# Patient Record
Sex: Female | Born: 1935 | Race: White | Hispanic: No | Smoking: Never smoker
Health system: Southern US, Community
[De-identification: ages and names within clinical notes are randomized; demographics above are authoritative.]

## PROBLEM LIST (undated history)

## (undated) DIAGNOSIS — I4891 Unspecified atrial fibrillation: Secondary | ICD-10-CM

## (undated) DIAGNOSIS — I10 Essential (primary) hypertension: Secondary | ICD-10-CM

## (undated) DIAGNOSIS — J45909 Unspecified asthma, uncomplicated: Secondary | ICD-10-CM

## (undated) DIAGNOSIS — J449 Chronic obstructive pulmonary disease, unspecified: Secondary | ICD-10-CM

## (undated) DIAGNOSIS — M1990 Unspecified osteoarthritis, unspecified site: Secondary | ICD-10-CM

---

## 2013-09-26 ENCOUNTER — Encounter (HOSPITAL_BASED_OUTPATIENT_CLINIC_OR_DEPARTMENT_OTHER): Payer: Self-pay | Admitting: Emergency Medicine

## 2013-09-26 ENCOUNTER — Emergency Department (HOSPITAL_BASED_OUTPATIENT_CLINIC_OR_DEPARTMENT_OTHER): Payer: Medicare Other

## 2013-09-26 ENCOUNTER — Emergency Department (HOSPITAL_BASED_OUTPATIENT_CLINIC_OR_DEPARTMENT_OTHER)
Admission: EM | Admit: 2013-09-26 | Discharge: 2013-09-26 | Disposition: A | Discharge status: Home / Self Care | Payer: Medicare Other | Attending: Emergency Medicine | Admitting: Emergency Medicine

## 2013-09-26 DIAGNOSIS — I10 Essential (primary) hypertension: Secondary | ICD-10-CM | POA: Insufficient documentation

## 2013-09-26 DIAGNOSIS — R000 Tachycardia, unspecified: Secondary | ICD-10-CM | POA: Insufficient documentation

## 2013-09-26 DIAGNOSIS — J449 Chronic obstructive pulmonary disease, unspecified: Secondary | ICD-10-CM | POA: Insufficient documentation

## 2013-09-26 DIAGNOSIS — S62609A Fracture of unspecified phalanx of unspecified finger, initial encounter for closed fracture: Secondary | ICD-10-CM

## 2013-09-26 DIAGNOSIS — Z7901 Long term (current) use of anticoagulants: Secondary | ICD-10-CM | POA: Insufficient documentation

## 2013-09-26 DIAGNOSIS — J4489 Other specified chronic obstructive pulmonary disease: Secondary | ICD-10-CM | POA: Insufficient documentation

## 2013-09-26 DIAGNOSIS — IMO0002 Reserved for concepts with insufficient information to code with codable children: Secondary | ICD-10-CM | POA: Insufficient documentation

## 2013-09-26 DIAGNOSIS — Y9289 Other specified places as the place of occurrence of the external cause: Secondary | ICD-10-CM | POA: Insufficient documentation

## 2013-09-26 DIAGNOSIS — I4891 Unspecified atrial fibrillation: Secondary | ICD-10-CM | POA: Insufficient documentation

## 2013-09-26 DIAGNOSIS — Z79899 Other long term (current) drug therapy: Secondary | ICD-10-CM | POA: Insufficient documentation

## 2013-09-26 DIAGNOSIS — S6000XA Contusion of unspecified finger without damage to nail, initial encounter: Secondary | ICD-10-CM | POA: Insufficient documentation

## 2013-09-26 DIAGNOSIS — S79919A Unspecified injury of unspecified hip, initial encounter: Secondary | ICD-10-CM | POA: Insufficient documentation

## 2013-09-26 DIAGNOSIS — M129 Arthropathy, unspecified: Secondary | ICD-10-CM | POA: Insufficient documentation

## 2013-09-26 DIAGNOSIS — W06XXXA Fall from bed, initial encounter: Secondary | ICD-10-CM | POA: Insufficient documentation

## 2013-09-26 DIAGNOSIS — Y9389 Activity, other specified: Secondary | ICD-10-CM | POA: Insufficient documentation

## 2013-09-26 DIAGNOSIS — S79929A Unspecified injury of unspecified thigh, initial encounter: Secondary | ICD-10-CM

## 2013-09-26 HISTORY — DX: Unspecified atrial fibrillation: I48.91

## 2013-09-26 HISTORY — DX: Unspecified asthma, uncomplicated: J45.909

## 2013-09-26 HISTORY — DX: Unspecified osteoarthritis, unspecified site: M19.90

## 2013-09-26 HISTORY — DX: Chronic obstructive pulmonary disease, unspecified: J44.9

## 2013-09-26 HISTORY — DX: Essential (primary) hypertension: I10

## 2013-09-26 MED ORDER — CLONIDINE HCL 0.1 MG PO TABS
0.1000 mg | ORAL_TABLET | Freq: Once | ORAL | Status: AC
  Administered 2013-09-26: 0.1 mg via ORAL
  Filled 2013-09-26: qty 1

## 2013-09-26 NOTE — ED Notes (Signed)
Patient transported to X-ray 

## 2013-09-26 NOTE — ED Notes (Signed)
Sitting on bed putting on shoes and fell out of bed.  Right 5th digit pain

## 2013-09-26 NOTE — ED Notes (Signed)
Waiting for a ride

## 2013-09-26 NOTE — Discharge Instructions (Signed)
Finger Fracture  Fractures of fingers are breaks in the bones of the fingers. There are many types of fractures. There are different ways of treating these fractures. Your health care provider will discuss the best way to treat your fracture.  CAUSES  Traumatic injury is the main cause of broken fingers. These include:  · Injuries while playing sports.  · Workplace injuries.  · Falls.  RISK FACTORS  Activities that can increase your risk of finger fractures include:  · Sports.  · Workplace activities that involve machinery.  · A condition called osteoporosis, which can make your bones less dense and cause them to fracture more easily.  SIGNS AND SYMPTOMS  The main symptoms of a broken finger are pain and swelling within 15 minutes after the injury. Other symptoms include:  · Bruising of your finger.  · Stiffness of your finger.  · Numbness of your finger.  · Exposed bones (compound fracture) if the fracture is severe.  DIAGNOSIS   The best way to diagnose a broken bone is with X-ray imaging. Additionally, your health care provider will use this X-ray image to evaluate the position of the broken finger bones.   TREATMENT   Finger fractures can be treated with:   · Nonreduction This means the bones are in place. The finger is splinted without changing the positions of the bone pieces. The splint is usually left on for about a week to 10 days. This will depend on your fracture and what your health care provider thinks.  · Closed reduction The bones are put back into position without using surgery. The finger is then splinted.  · Open reduction and internal fixation The fracture site is opened. Then the bone pieces are fixed into place with pins or some type of hardware. This is seldom required. It depends on the severity of the fracture.  HOME CARE INSTRUCTIONS   · Follow your health care provider's instructions regarding activities, exercises, and physical therapy.  · Only take over-the-counter or prescription  medicines for pain, discomfort, or fever as directed by your health care provider.  SEEK MEDICAL CARE IF:  You have pain or swelling that limits the motion or use of your fingers.  SEEK IMMEDIATE MEDICAL CARE IF:   Your finger becomes numb.  MAKE SURE YOU:   · Understand these instructions.  · Will watch your condition.  · Will get help right away if you are not doing well or get worse.  Document Released: 08/06/2000 Document Revised: 02/12/2013 Document Reviewed: 12/04/2012  [employer]® Patient Information ©2014 [employer].

## 2013-09-26 NOTE — ED Provider Notes (Addendum)
CSN: [id number]     Arrival date & time 09/26/13  1808 History   First MD Initiated Contact with Patient 09/26/13 1813     Chief Complaint  Patient presents with  . Fall     (Consider location/radiation/quality/duration/timing/severity/associated sxs/prior Treatment) HPI Comments: Patient was sitting on the side of her bed [facility]. She lost her balance while she was putting on her shoes and fell sideways landing on her right hand in her right hip. She has a little bit of soreness in her right hip and she has some bruising and pain to her right hand. She denies hitting her head. There is no loss of consciousness. She denies any headache or dizziness. She denies any other injuries. She says that she has been able to bear weight on the hip without significant pain.  Patient is a 80 y.o. female presenting with fall.  Fall Pertinent negatives include no abdominal pain, no headaches and no shortness of breath.    Past Medical History  Diagnosis Date  . Asthma   . A-fib   . COPD (chronic obstructive pulmonary disease)   . Hypertension   . Arthritis    No past surgical history on file. No family history on file. History  Substance Use Topics  . Smoking status: Never Smoker   . Smokeless tobacco: Not on file  . Alcohol Use: No   OB History   Grav Para Term Preterm Abortions TAB SAB Ect Mult Living                 Review of Systems  Constitutional: Negative for fever.  HENT: Negative.  Negative for nosebleeds.   Respiratory: Negative for chest tightness and shortness of breath.   Cardiovascular: Negative for leg swelling.  Gastrointestinal: Negative for nausea, vomiting and abdominal pain.  Musculoskeletal: Positive for arthralgias and joint swelling. Negative for back pain and neck pain.  Skin: Negative for wound.  Neurological: Negative for weakness, numbness and headaches.  Psychiatric/Behavioral: Negative for confusion.      Allergies  Review of  patient's allergies indicates not on file.  Home Medications   Prior to Admission medications   Medication Sig Start Date End Date Taking? Authorizing Provider  acetaminophen (TYLENOL) 325 MG tablet Take 650 mg by mouth every 6 (six) hours as needed.   Yes Historical Provider, MD  amiodarone (PACERONE) 200 MG tablet Take 200 mg by mouth daily.   Yes Historical Provider, MD  cholecalciferol (VITAMIN D) 1000 UNITS tablet Take 2,000 Units by mouth daily.   Yes Historical Provider, MD  denosumab (PROLIA) 60 MG/ML SOLN injection Inject 60 mg into the skin every 6 (six) months. Administer in upper arm, thigh, or abdomen   Yes Historical Provider, MD  digoxin (LANOXIN) 0.125 MG tablet Take by mouth daily.   Yes Historical Provider, MD  docusate sodium (COLACE) 100 MG capsule Take 100 mg by mouth 2 (two) times daily.   Yes Historical Provider, MD  gabapentin (NEURONTIN) 300 MG capsule Take 300 mg by mouth 3 (three) times daily.   Yes Historical Provider, MD  levothyroxine (SYNTHROID, LEVOTHROID) 75 MCG tablet Take 75 mcg by mouth daily before breakfast.   Yes Historical Provider, MD  spironolactone (ALDACTONE) 25 MG tablet Take 25 mg by mouth daily.   Yes Historical Provider, MD  tamsulosin (FLOMAX) 0.4 MG CAPS capsule Take 0.4 mg by mouth.   Yes Historical Provider, MD  terbinafine (LAMISIL) 250 MG tablet Take 250 mg by mouth daily.  Yes Historical Provider, MD  warfarin (COUMADIN) 4 MG tablet Take 4 mg by mouth daily.   Yes Historical Provider, MD   BP 230/114  Pulse 62  Temp(Src) 97.8 F (36.6 C) (Oral)  Resp 16  Wt 101 lb (45.813 kg)  SpO2 99% Physical Exam  Constitutional: She is oriented to person, place, and time. She appears well-developed and well-nourished.  HENT:  Head: Normocephalic and atraumatic.  Eyes: Pupils are equal, round, and reactive to light.  Neck: Normal range of motion. Neck supple.  No pain along the spine  Cardiovascular: Normal rate and regular rhythm.   Murmur  heard. Pulmonary/Chest: Effort normal and breath sounds normal. No respiratory distress. She has no wheezes. She has no rales. She exhibits no tenderness.  Abdominal: Soft. Bowel sounds are normal. There is no tenderness. There is no rebound and no guarding.  Musculoskeletal: Normal range of motion. She exhibits edema and tenderness.  Positive swelling to the fifth finger of the right hand. There is ecchymosis and some ulnar deviation of the finger. She has normal sensation distally. She is able to flex and extend the finger with out significant deficits. She has no other pain on palpation or range of motion of the extremities including the hips.  Lymphadenopathy:    She has no cervical adenopathy.  Neurological: She is alert and oriented to person, place, and time.  Skin: Skin is warm and dry. No rash noted.  Psychiatric: She has a normal mood and affect.    ED Course  Procedures (including critical care time) Labs Review Labs Reviewed - No data to display  Imaging Review Dg Hand Complete Right  09/26/2013   CLINICAL DATA:  Fall today.  Small finger pain.  EXAM: RIGHT HAND - COMPLETE 3+ VIEW  COMPARISON:  None.  FINDINGS: The bones are diffusely demineralized. There is an oblique mildly displaced extra-articular fracture involving the proximal base of the fifth proximal phalanx. No other fractures are identified. There are advanced degenerative changes at the first carpal metacarpal articulation. Lesser degenerative changes are noted at the distal interphalangeal joint of the small finger.  IMPRESSION: Oblique mildly displaced fracture of the fifth proximal phalanx as described.   Electronically Signed   By: Roxy HorsemanBill  Veazey M.D.   On: 09/26/2013 19:01     EKG Interpretation None      MDM   Final diagnoses:  Fracture of phalanx of finger    Patient presents with a swollen finger. There is a fracture of the proximal phalanx of the fifth digit on the right hand. The finger was splinted  and buddy taped to the adjacent finger. She denies need for any pain medication. She has no other apparent injuries. She's ambulating in the ED without difficulty or pain on her head. She denies any head trauma. She is on Coumadin but there is no bruising or signs of head trauma and she denies hitting her head. She was referred to the hand surgeon on call for outpatient followup.  PT's blood pressure was markedly elevated in the ED.  She said that she has not yet had her BP meds.  Denies any symptoms.  Was given a dose of PO clonidine.  Rolan BuccoMelanie Millan Legan, MD 09/26/13 2015  Rolan BuccoMelanie Nico Syme, MD 09/26/13 2106

## 2013-09-26 NOTE — ED Notes (Signed)
Waiting for [facility] to come pick up pt

## 2013-11-29 ENCOUNTER — Encounter (HOSPITAL_BASED_OUTPATIENT_CLINIC_OR_DEPARTMENT_OTHER): Payer: Self-pay | Admitting: Emergency Medicine

## 2013-11-29 ENCOUNTER — Emergency Department (HOSPITAL_BASED_OUTPATIENT_CLINIC_OR_DEPARTMENT_OTHER)
Admission: EM | Admit: 2013-11-29 | Discharge: 2013-11-29 | Disposition: A | Discharge status: Home / Self Care | Payer: Medicare Other | Attending: Emergency Medicine | Admitting: Emergency Medicine

## 2013-11-29 DIAGNOSIS — I4891 Unspecified atrial fibrillation: Secondary | ICD-10-CM | POA: Insufficient documentation

## 2013-11-29 DIAGNOSIS — J449 Chronic obstructive pulmonary disease, unspecified: Secondary | ICD-10-CM | POA: Diagnosis not present

## 2013-11-29 DIAGNOSIS — Z8739 Personal history of other diseases of the musculoskeletal system and connective tissue: Secondary | ICD-10-CM | POA: Diagnosis not present

## 2013-11-29 DIAGNOSIS — Z880 Allergy status to penicillin: Secondary | ICD-10-CM | POA: Diagnosis not present

## 2013-11-29 DIAGNOSIS — N390 Urinary tract infection, site not specified: Secondary | ICD-10-CM

## 2013-11-29 DIAGNOSIS — I10 Essential (primary) hypertension: Secondary | ICD-10-CM | POA: Insufficient documentation

## 2013-11-29 DIAGNOSIS — R350 Frequency of micturition: Secondary | ICD-10-CM | POA: Insufficient documentation

## 2013-11-29 DIAGNOSIS — Z7901 Long term (current) use of anticoagulants: Secondary | ICD-10-CM | POA: Insufficient documentation

## 2013-11-29 DIAGNOSIS — Z79899 Other long term (current) drug therapy: Secondary | ICD-10-CM | POA: Insufficient documentation

## 2013-11-29 DIAGNOSIS — J4489 Other specified chronic obstructive pulmonary disease: Secondary | ICD-10-CM | POA: Insufficient documentation

## 2013-11-29 LAB — URINALYSIS, ROUTINE W REFLEX MICROSCOPIC
BILIRUBIN URINE: NEGATIVE
Glucose, UA: NEGATIVE mg/dL
KETONES UR: NEGATIVE mg/dL
Nitrite: NEGATIVE
PH: 7.5 (ref 5.0–8.0)
Protein, ur: NEGATIVE mg/dL
Specific Gravity, Urine: 1.007 (ref 1.005–1.030)
Urobilinogen, UA: 0.2 mg/dL (ref 0.0–1.0)

## 2013-11-29 LAB — URINE MICROSCOPIC-ADD ON

## 2013-11-29 MED ORDER — LEVOFLOXACIN 500 MG PO TABS: 500.0000 mg | ORAL_TABLET | Freq: Every day | ORAL | Status: AC

## 2013-11-29 NOTE — ED Notes (Signed)
Pt presents to ED with complaints of urinary frequency since 2 am this morning.

## 2013-11-29 NOTE — ED Provider Notes (Signed)
Complains of urinary frequency onset 1 AM today. No other symptoms. Patient alert no distress not acutely ill-appearing  Madeline Quinn Shateka Petrea, MD 11/29/13 1227

## 2013-11-29 NOTE — ED Provider Notes (Signed)
Medical screening examination/treatment/procedure(s) were conducted as a shared visit with non-physician practitioner(s) and myself.  I personally evaluated the patient during the encounter.   EKG Interpretation None       Kaylany Tesoriero, MD 11/29/13 1632 

## 2013-11-29 NOTE — ED Provider Notes (Signed)
CSN: [id number]     Arrival date & time 11/29/13  1100 History   First MD Initiated Contact with Patient 11/29/13 1205     Chief Complaint  Patient presents with  . Urinary Frequency     (Consider location/radiation/quality/duration/timing/severity/associated sxs/prior Treatment) Patient is a 80 y.o. female presenting with frequency. The history is provided by the patient. No language interpreter was used.  Urinary Frequency This is a new problem. The current episode started today. Pertinent negatives include no abdominal pain, fever or nausea. Associated symptoms comments: She woke at around 1:00 a.m this morning with the need to urinate and since has been going frequently. No dysuria, hematuria, fever, N, V. .    Past Medical History  Diagnosis Date  . Asthma   . A-fib   . COPD (chronic obstructive pulmonary disease)   . Hypertension   . Arthritis    History reviewed. No pertinent past surgical history. History reviewed. No pertinent family history. History  Substance Use Topics  . Smoking status: Never Smoker   . Smokeless tobacco: Not on file  . Alcohol Use: No   OB History   Grav Para Term Preterm Abortions TAB SAB Ect Mult Living                 Review of Systems  Constitutional: Negative for fever.  Gastrointestinal: Negative for nausea and abdominal pain.  Genitourinary: Positive for frequency. Negative for dysuria and hematuria.      Allergies  Amoxicillin and Vantin  Home Medications   Prior to Admission medications   Medication Sig Start Date End Date Taking? Authorizing Provider  acetaminophen (TYLENOL) 325 MG tablet Take 650 mg by mouth every 6 (six) hours as needed.    Historical Provider, MD  amiodarone (PACERONE) 200 MG tablet Take 200 mg by mouth daily.    Historical Provider, MD  cholecalciferol (VITAMIN D) 1000 UNITS tablet Take 2,000 Units by mouth daily.    Historical Provider, MD  denosumab (PROLIA) 60 MG/ML SOLN injection Inject 60 mg into  the skin every 6 (six) months. Administer in upper arm, thigh, or abdomen    Historical Provider, MD  digoxin (LANOXIN) 0.125 MG tablet Take by mouth daily.    Historical Provider, MD  docusate sodium (COLACE) 100 MG capsule Take 100 mg by mouth 2 (two) times daily.    Historical Provider, MD  gabapentin (NEURONTIN) 300 MG capsule Take 300 mg by mouth 3 (three) times daily.    Historical Provider, MD  levothyroxine (SYNTHROID, LEVOTHROID) 75 MCG tablet Take 75 mcg by mouth daily before breakfast.    Historical Provider, MD  spironolactone (ALDACTONE) 25 MG tablet Take 25 mg by mouth daily.    Historical Provider, MD  tamsulosin (FLOMAX) 0.4 MG CAPS capsule Take 0.4 mg by mouth.    Historical Provider, MD  terbinafine (LAMISIL) 250 MG tablet Take 250 mg by mouth daily.    Historical Provider, MD  warfarin (COUMADIN) 4 MG tablet Take 4 mg by mouth daily.    Historical Provider, MD   BP 201/98  Pulse 69  Temp(Src) 98.2 F (36.8 C) (Oral)  Resp 18  Ht 5\' 4"  (1.626 m)  Wt 101 lb (45.813 kg)  BMI 17.33 kg/m2  SpO2 100% Physical Exam  Constitutional: She is oriented to person, place, and time. She appears well-developed and well-nourished.  Neck: Normal range of motion.  Pulmonary/Chest: Effort normal. No respiratory distress.  Abdominal: Soft. There is no tenderness. There is no rebound and no  guarding.  Neurological: She is alert and oriented to person, place, and time.  Skin: Skin is warm and dry.  Psychiatric: She has a normal mood and affect.    ED Course  Procedures (including critical care time) Labs Review Labs Reviewed  URINALYSIS, ROUTINE W REFLEX MICROSCOPIC - Abnormal; Notable for the following:    APPearance CLOUDY (*)    Hgb urine dipstick SMALL (*)    Leukocytes, UA LARGE (*)    All other components within normal limits  URINE MICROSCOPIC-ADD ON - Abnormal; Notable for the following:    Bacteria, UA FEW (*)    All other components within normal limits  URINE CULTURE     Imaging Review No results found.   EKG Interpretation None      MDM   Final diagnoses:  None    UTI  She refuses blood testing for INR and Bmet, stating it is checked on a weekly basis at the NH where she lives. Patient is stable, appears well and non-toxic. Stable for discharge. Urine culture pending.    Arnoldo Hooker, PA-C 11/29/13 1254

## 2013-11-29 NOTE — Discharge Instructions (Signed)

## 2013-11-29 NOTE — ED Notes (Signed)
Pt refused blood draw. PA notified 

## 2013-11-29 NOTE — ED Notes (Signed)
Pt discharged to home NAD.  

## 2013-11-30 LAB — URINE CULTURE

## 2014-11-12 ENCOUNTER — Emergency Department (HOSPITAL_BASED_OUTPATIENT_CLINIC_OR_DEPARTMENT_OTHER)
Admission: EM | Admit: 2014-11-12 | Discharge: 2014-11-12 | Disposition: A | Discharge status: Home / Self Care | Payer: Medicare Other | Attending: Emergency Medicine | Admitting: Emergency Medicine

## 2014-11-12 ENCOUNTER — Encounter (HOSPITAL_BASED_OUTPATIENT_CLINIC_OR_DEPARTMENT_OTHER): Payer: Self-pay | Admitting: Emergency Medicine

## 2014-11-12 DIAGNOSIS — I4891 Unspecified atrial fibrillation: Secondary | ICD-10-CM | POA: Insufficient documentation

## 2014-11-12 DIAGNOSIS — Z7901 Long term (current) use of anticoagulants: Secondary | ICD-10-CM | POA: Diagnosis not present

## 2014-11-12 DIAGNOSIS — L97819 Non-pressure chronic ulcer of other part of right lower leg with unspecified severity: Secondary | ICD-10-CM | POA: Insufficient documentation

## 2014-11-12 DIAGNOSIS — Z7982 Long term (current) use of aspirin: Secondary | ICD-10-CM | POA: Insufficient documentation

## 2014-11-12 DIAGNOSIS — Z79899 Other long term (current) drug therapy: Secondary | ICD-10-CM | POA: Diagnosis not present

## 2014-11-12 DIAGNOSIS — M1990 Unspecified osteoarthritis, unspecified site: Secondary | ICD-10-CM | POA: Insufficient documentation

## 2014-11-12 DIAGNOSIS — Z880 Allergy status to penicillin: Secondary | ICD-10-CM | POA: Diagnosis not present

## 2014-11-12 DIAGNOSIS — Z792 Long term (current) use of antibiotics: Secondary | ICD-10-CM | POA: Diagnosis not present

## 2014-11-12 DIAGNOSIS — I10 Essential (primary) hypertension: Secondary | ICD-10-CM | POA: Diagnosis not present

## 2014-11-12 DIAGNOSIS — J449 Chronic obstructive pulmonary disease, unspecified: Secondary | ICD-10-CM | POA: Diagnosis not present

## 2014-11-12 DIAGNOSIS — R791 Abnormal coagulation profile: Secondary | ICD-10-CM | POA: Insufficient documentation

## 2014-11-12 DIAGNOSIS — S81801A Unspecified open wound, right lower leg, initial encounter: Secondary | ICD-10-CM

## 2014-11-12 NOTE — ED Provider Notes (Signed)
CSN: [id number]     Arrival date & time 11/12/14  1646 History   First MD Initiated Contact with Patient 11/12/14 1659     Chief Complaint  Patient presents with  . bleeding leg wound      (Consider location/radiation/quality/duration/timing/severity/associated sxs/prior Treatment) HPI  81 year old female presents from her independent living facility with bleeding from a right lower leg wound. She is on Coumadin in 2 days ago was told her INR was around 5. Since then she has been told to hold it for the next several days and will get reevaluated by her doctor. She had a bruise of uncertain etiology that turned into a blood blister. This morning prior to going into the shower she noticed that it was bleeding. She was having trouble stopping the bleeding and so she came in to the ER. The wound is currently not bleeding. Denies any trauma. Denies any lightheadedness, chest pain, or trouble breathing. Patient is unsure for last tetanus immunization and declines immunization now.  Past Medical History  Diagnosis Date  . Asthma   . A-fib   . COPD (chronic obstructive pulmonary disease)   . Hypertension   . Arthritis    History reviewed. No pertinent past surgical history. No family history on file. History  Substance Use Topics  . Smoking status: Never Smoker   . Smokeless tobacco: Not on file  . Alcohol Use: No   OB History    No data available     Review of Systems  Respiratory: Negative for shortness of breath.   Cardiovascular: Negative for chest pain.  Musculoskeletal: Negative for myalgias and arthralgias.  Skin: Positive for wound.  Neurological: Negative for weakness and light-headedness.  All other systems reviewed and are negative.     Allergies  Amoxicillin and Vantin  Home Medications   Prior to Admission medications   Medication Sig Start Date End Date Taking? Authorizing Provider  amiodarone (PACERONE) 200 MG tablet Take 100 mg by mouth daily.    Yes  Historical Provider, MD  aspirin 81 MG tablet Take 81 mg by mouth daily.   Yes Historical Provider, MD  cholecalciferol (VITAMIN D) 1000 UNITS tablet Take 2,000 Units by mouth daily.   Yes Historical Provider, MD  denosumab (PROLIA) 60 MG/ML SOLN injection Inject 60 mg into the skin every 6 (six) months. Administer in upper arm, thigh, or abdomen   Yes Historical Provider, MD  docusate sodium (COLACE) 100 MG capsule Take 100 mg by mouth 2 (two) times daily.   Yes Historical Provider, MD  gabapentin (NEURONTIN) 300 MG capsule Take 100 mg by mouth 3 (three) times daily.    Yes Historical Provider, MD  levothyroxine (SYNTHROID, LEVOTHROID) 75 MCG tablet Take 100 mcg by mouth daily before breakfast.    Yes Historical Provider, MD  warfarin (COUMADIN) 4 MG tablet Take 4 mg by mouth daily.   Yes Historical Provider, MD  acetaminophen (TYLENOL) 325 MG tablet Take 650 mg by mouth every 6 (six) hours as needed.    Historical Provider, MD  digoxin (LANOXIN) 0.125 MG tablet Take by mouth daily.    Historical Provider, MD  levofloxacin (LEVAQUIN) 500 MG tablet Take 1 tablet (500 mg total) by mouth daily. 11/29/13   Elpidio AnisShari Upstill, PA-C  spironolactone (ALDACTONE) 25 MG tablet Take 25 mg by mouth daily.    Historical Provider, MD  tamsulosin (FLOMAX) 0.4 MG CAPS capsule Take 0.4 mg by mouth.    Historical Provider, MD  terbinafine (LAMISIL) 250 MG tablet  Take 250 mg by mouth daily.    Historical Provider, MD   BP 201/103 mmHg  Pulse 78  Temp(Src) 98.8 F (37.1 C) (Oral)  Resp 20  Wt 105 lb (47.628 kg)  SpO2 98% Physical Exam  Constitutional: She is oriented to person, place, and time. She appears well-developed and well-nourished.  HENT:  Head: Normocephalic and atraumatic.  Right Ear: External ear normal.  Left Ear: External ear normal.  Nose: Nose normal.  Eyes: Right eye exhibits no discharge. Left eye exhibits no discharge.  Cardiovascular: Intact distal pulses.   Pulmonary/Chest: Effort normal.    Abdominal: She exhibits no distension.  Neurological: She is alert and oriented to person, place, and time.  Skin: Skin is warm and dry.     Nursing note and vitals reviewed.   ED Course  Procedures (including critical care time) Labs Review Labs Reviewed - No data to display  Imaging Review No results found.   EKG Interpretation None      MDM   Final diagnoses:  Open wound, lower leg, right, initial encounter    Patient is requesting vitamin K given that the facility told her her INR was supratherapeutic and she has this minor leg wound. However given that she is not actively bleeding, shows no signs of significant blood loss, and bleeding is controlled I do not feel that vitamin K or immediate reversal as indicated. She already has plan with her physician as an outpatient. Will dress the wound and discussed strict return precautions, no repair needed at this time.    Pricilla Loveless, MD 11/12/14 [id number]

## 2014-11-12 NOTE — ED Notes (Addendum)
[facility]  Pt noticed after shower this morning that a blister on her right lower leg was bleeding.  Has tried to stop the bleeding all day without success.  No active bleeding upon arrival. Pt had blood work done yesterday and was told to hold her Coumadin yesterday through Sunday,  (4 days in all) and then start back at 2mg  dose.

## 2014-11-12 NOTE — ED Notes (Signed)
Phoned [facility] spoke with Jasmine DecemberSharon who will send a [facility]ena[facility] for pick up

## 2014-11-12 NOTE — ED Notes (Signed)
MD at bedside. 

## 2016-09-11 ENCOUNTER — Emergency Department (HOSPITAL_BASED_OUTPATIENT_CLINIC_OR_DEPARTMENT_OTHER)
Admission: EM | Admit: 2016-09-11 | Discharge: 2016-09-12 | Disposition: A | Discharge status: Home / Self Care | Payer: Medicare Other | Attending: Emergency Medicine | Admitting: Emergency Medicine

## 2016-09-11 ENCOUNTER — Emergency Department (HOSPITAL_BASED_OUTPATIENT_CLINIC_OR_DEPARTMENT_OTHER): Payer: Medicare Other

## 2016-09-11 ENCOUNTER — Encounter (HOSPITAL_BASED_OUTPATIENT_CLINIC_OR_DEPARTMENT_OTHER): Payer: Self-pay | Admitting: Emergency Medicine

## 2016-09-11 DIAGNOSIS — J45909 Unspecified asthma, uncomplicated: Secondary | ICD-10-CM | POA: Insufficient documentation

## 2016-09-11 DIAGNOSIS — S81811A Laceration without foreign body, right lower leg, initial encounter: Secondary | ICD-10-CM

## 2016-09-11 DIAGNOSIS — J449 Chronic obstructive pulmonary disease, unspecified: Secondary | ICD-10-CM | POA: Diagnosis not present

## 2016-09-11 DIAGNOSIS — Y929 Unspecified place or not applicable: Secondary | ICD-10-CM | POA: Insufficient documentation

## 2016-09-11 DIAGNOSIS — Z7982 Long term (current) use of aspirin: Secondary | ICD-10-CM | POA: Diagnosis not present

## 2016-09-11 DIAGNOSIS — Z23 Encounter for immunization: Secondary | ICD-10-CM | POA: Diagnosis not present

## 2016-09-11 DIAGNOSIS — Y939 Activity, unspecified: Secondary | ICD-10-CM | POA: Insufficient documentation

## 2016-09-11 DIAGNOSIS — S8991XA Unspecified injury of right lower leg, initial encounter: Secondary | ICD-10-CM | POA: Diagnosis present

## 2016-09-11 DIAGNOSIS — I10 Essential (primary) hypertension: Secondary | ICD-10-CM | POA: Diagnosis not present

## 2016-09-11 DIAGNOSIS — W06XXXA Fall from bed, initial encounter: Secondary | ICD-10-CM | POA: Diagnosis not present

## 2016-09-11 DIAGNOSIS — Y999 Unspecified external cause status: Secondary | ICD-10-CM | POA: Diagnosis not present

## 2016-09-11 MED ORDER — TETANUS-DIPHTH-ACELL PERTUSSIS 5-2.5-18.5 LF-MCG/0.5 IM SUSP
0.5000 mL | Freq: Once | INTRAMUSCULAR | Status: AC
  Administered 2016-09-11: 0.5 mL via INTRAMUSCULAR
  Filled 2016-09-11: qty 0.5

## 2016-09-11 MED ORDER — LIDOCAINE-EPINEPHRINE (PF) 2 %-1:200000 IJ SOLN
20.0000 mL | Freq: Once | INTRAMUSCULAR | Status: AC
  Administered 2016-09-11: 20 mL via INTRADERMAL
  Filled 2016-09-11: qty 20

## 2016-09-11 NOTE — ED Triage Notes (Signed)
Pt slid off bed and hit ankle on bed. Pt has laceration to right ankle. [facility].

## 2016-09-11 NOTE — ED Notes (Signed)
Patient transported to X-ray 

## 2016-09-11 NOTE — ED Provider Notes (Signed)
[facility] Provider Note   CSN: [id number] Arrival date & time: 09/11/16  2320  By signing my name below, I, Rosana Fret, attest that this documentation has been prepared under the direction and in the presence of Pricilla Loveless, MD. Electronically Signed: Rosana Fret, ED Scribe. 09/11/16. 11:35 PM.  History   Chief Complaint Chief Complaint  Patient presents with  . Extremity Laceration    The history is provided by the patient. No language interpreter was used.    HPI Comments: Madeline Quinn is a 83 y.o. female who presents to the Emergency Department complaining of right lower leg laceration that occurred this evening. She states she was getting into bed and struck her foot on a board. However, she did not fall, lose consciousness or have head trauma. Pt states she takes Coumadin regularly. Per pt, she is unsure of Tetanus status.  Pt denies ankle pain or RLE numbness. No weakness.   Past Medical History:  Diagnosis Date  . A-fib (HCC)   . Arthritis   . Asthma   . COPD (chronic obstructive pulmonary disease) (HCC)   . Hypertension     There are no active problems to display for this patient.   History reviewed. No pertinent surgical history.  OB History    No data available       Home Medications    Prior to Admission medications   Medication Sig Start Date End Date Taking? Authorizing Provider  acetaminophen (TYLENOL) 325 MG tablet Take 650 mg by mouth every 6 (six) hours as needed.    [provider]  amiodarone (PACERONE) 200 MG tablet Take 100 mg by mouth daily.     [provider]  aspirin 81 MG tablet Take 81 mg by mouth daily.    [provider]  cholecalciferol (VITAMIN D) 1000 UNITS tablet Take 2,000 Units by mouth daily.    [provider]  denosumab (PROLIA) 60 MG/ML SOLN injection Inject 60 mg into the skin every 6 (six) months. Administer in upper arm, thigh, or abdomen    [provider]  digoxin (LANOXIN) 0.125 MG tablet Take by mouth daily.    [provider]  docusate sodium (COLACE) 100 MG capsule Take 100 mg by mouth 2 (two) times daily.    [provider]  gabapentin (NEURONTIN) 300 MG capsule Take 100 mg by mouth 3 (three) times daily.     [provider]  levofloxacin (LEVAQUIN) 500 MG tablet Take 1 tablet (500 mg total) by mouth daily. 11/29/13   Elpidio Anis, PA-C  levothyroxine (SYNTHROID, LEVOTHROID) 75 MCG tablet Take 100 mcg by mouth daily before breakfast.     [provider]  spironolactone (ALDACTONE) 25 MG tablet Take 25 mg by mouth daily.    [provider]  tamsulosin (FLOMAX) 0.4 MG CAPS capsule Take 0.4 mg by mouth.    [provider]  terbinafine (LAMISIL) 250 MG tablet Take 250 mg by mouth daily.    [provider]  warfarin (COUMADIN) 4 MG tablet Take 4 mg by mouth daily.    [provider]    Family History History reviewed. No pertinent family history.  Social History Social History  Substance Use Topics  . Smoking status: Never Smoker  . Smokeless tobacco: Never Used  . Alcohol use No     Allergies   Amoxicillin and Vantin [cefpodoxime]   Review of Systems Review of Systems  Musculoskeletal: Negative for myalgias.  Skin: Positive for wound.  Neurological: Negative for syncope and numbness.  Hematological: Bruises/bleeds easily.  All other systems reviewed and are negative.    Physical Exam Updated Vital Signs BP (!) 200/98 (BP Location: Left Arm)   Pulse 67   Temp 97.8 F (36.6 C) (Oral)   Resp 20   Wt 102 lb (46.3 kg)   SpO2 95%   BMI 17.51 kg/m   Physical Exam  Constitutional: She is oriented to person, place, and time. She appears well-developed and well-nourished.  HENT:  Head: Normocephalic and atraumatic.  Right Ear: External ear normal.  Left Ear: External ear normal.  Nose: Nose normal.  Eyes: Right eye exhibits no  discharge. Left eye exhibits no discharge.  Cardiovascular: Normal rate, regular rhythm and normal heart sounds.   Pulmonary/Chest: Effort normal and breath sounds normal.  Abdominal: Soft. There is no tenderness.  Musculoskeletal:  L-shaped laceration to her lateral right lower leg with fat exposed. No bony tenderness, normal ankle ROM. 2+ DP pulse  Neurological: She is alert and oriented to person, place, and time.  Skin: Skin is warm and dry.  Nursing note and vitals reviewed.    ED Treatments / Results  DIAGNOSTIC STUDIES: Oxygen Saturation is 96% on RA, normal by my interpretation.   COORDINATION OF CARE: 11:34 PM-Discussed next steps with pt. Pt verbalized understanding and is agreeable with the plan.  Labs (all labs ordered are listed, but only abnormal results are displayed) Labs Reviewed - No data to display  EKG  EKG Interpretation None       Radiology Dg Tibia/fibula Right  Result Date: 09/12/2016 CLINICAL DATA:  Larey SeatFell from bed 1 hour ago. Laceration to the lateral aspect distal third right lower leg. EXAM: RIGHT TIBIA AND FIBULA - 2 VIEW COMPARISON:  None. FINDINGS: Focal skin defect along the lateral aspect of the distal right lower leg. This corresponds to the history of laceration. No radiopaque soft tissue foreign bodies. Bones appear intact. No evidence of acute fracture or dislocation. No focal bone lesion or bone destruction. Vascular calcifications. IMPRESSION: Skin laceration to the distal right lower leg. No acute bony abnormalities. Electronically Signed   By: Burman NievesWilliam  Stevens M.D.   On: 09/12/2016 00:13    Procedures .Marland Kitchen.Laceration Repair Date/Time: 09/12/2016 12:45 AM Performed by: Pricilla LovelessGOLDSTON, Armel Rabbani Authorized by: Pricilla LovelessGOLDSTON, Brentton Wardlow   Consent:    Consent obtained:  Verbal   Consent given by:  Patient Anesthesia (see MAR for exact dosages):    Anesthesia method:  Local infiltration   Local anesthetic:  Lidocaine 2% WITH epi Laceration details:     Location:  Leg   Leg location:  R lower leg   Length (cm):  5 Repair type:    Repair type:  Simple Pre-procedure details:    Preparation:  Patient was prepped and draped in usual sterile fashion and imaging obtained to evaluate for foreign bodies Treatment:    Amount of cleaning:  Standard   Irrigation solution:  Sterile saline   Irrigation method:  Syringe Skin repair:    Repair method:  Sutures   Suture size:  4-0   Suture material:  Prolene   Suture technique:  Simple interrupted   Number of sutures:  6 Approximation:    Approximation:  Close Post-procedure details:    Dressing:  Antibiotic ointment   Patient tolerance of procedure:  Tolerated well, no immediate complications   (including critical care time)  Medications Ordered in ED Medications  Tdap (BOOSTRIX) injection 0.5 mL (0.5 mLs Intramuscular Given 09/11/16 2344)  lidocaine-EPINEPHrine (XYLOCAINE W/EPI) 2 %-1:200000 (PF) injection 20 mL (20 mLs Intradermal Given 09/11/16 2346)     Initial Impression / Assessment and Plan / ED Course  I have reviewed the triage vital signs and the nursing notes.  Pertinent labs & imaging results that were available during my care of the patient were reviewed by me and considered in my medical decision making (see chart for details).     Patient with a relatively uncomplicated angular leg laceration. Neurovascularly intact. Repaired as above. No obvious foreign bodies and was irrigated at the bedside. Discharge home, follow-up with PCP for suture removal. Discussed wound care precautions.  Final Clinical Impressions(s) / ED Diagnoses   Final diagnoses:  Laceration of right lower extremity, initial encounter    New Prescriptions New Prescriptions   No medications on file   I personally performed the services described in this documentation, which was scribed in my presence. The recorded information has been reviewed and is accurate.    Pricilla Loveless, MD 09/12/16 [phone]

## 2016-09-11 NOTE — ED Notes (Signed)
ED Provider at bedside. 

## 2016-09-12 ENCOUNTER — Encounter (HOSPITAL_BASED_OUTPATIENT_CLINIC_OR_DEPARTMENT_OTHER): Payer: Self-pay | Admitting: Adult Health

## 2016-09-12 NOTE — ED Notes (Signed)
Pt verbalizes understanding of d/c instructions and denies any further needs at this time. 

## 2018-10-26 IMAGING — DX DG TIBIA/FIBULA 2V*R*
3 series · 3 of 3 positions shown · non-contrast
Comparison: None.

CLINICAL DATA: Fell from bed 1 hour ago. Laceration to the lateral
aspect distal third right lower leg.

EXAM:
RIGHT TIBIA AND FIBULA - 2 VIEW

[tibia ap]
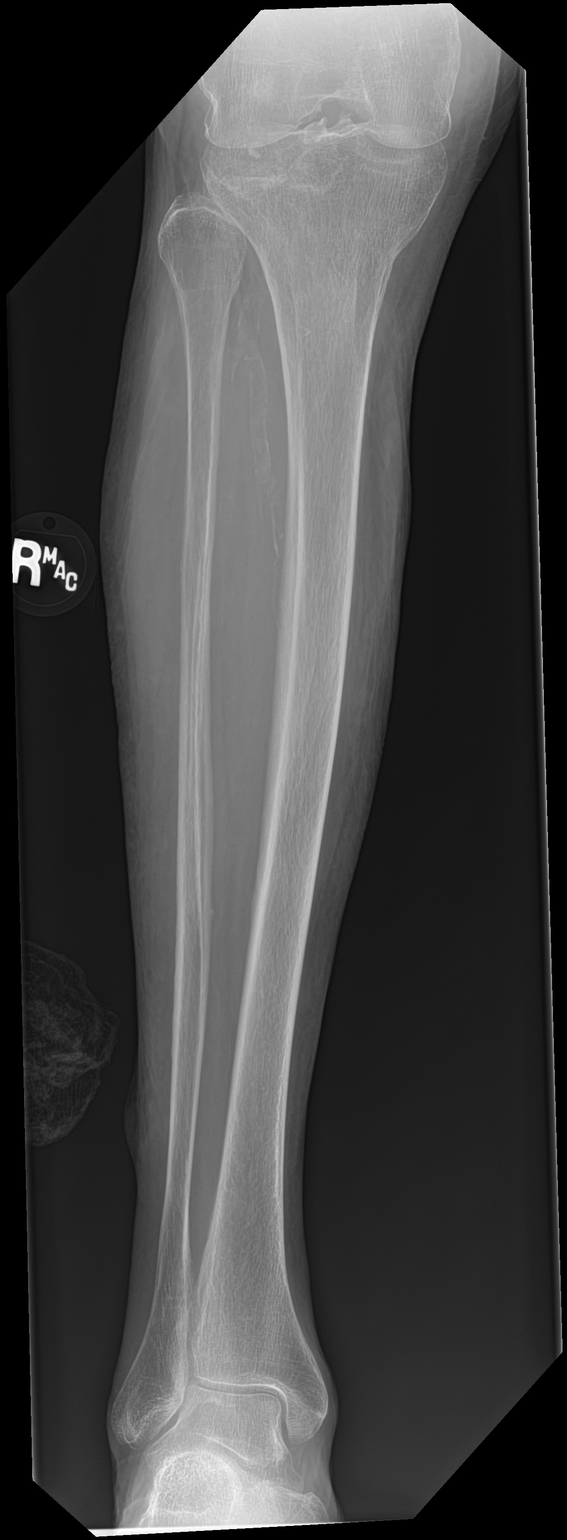

[tibia lat (1 of 2)]
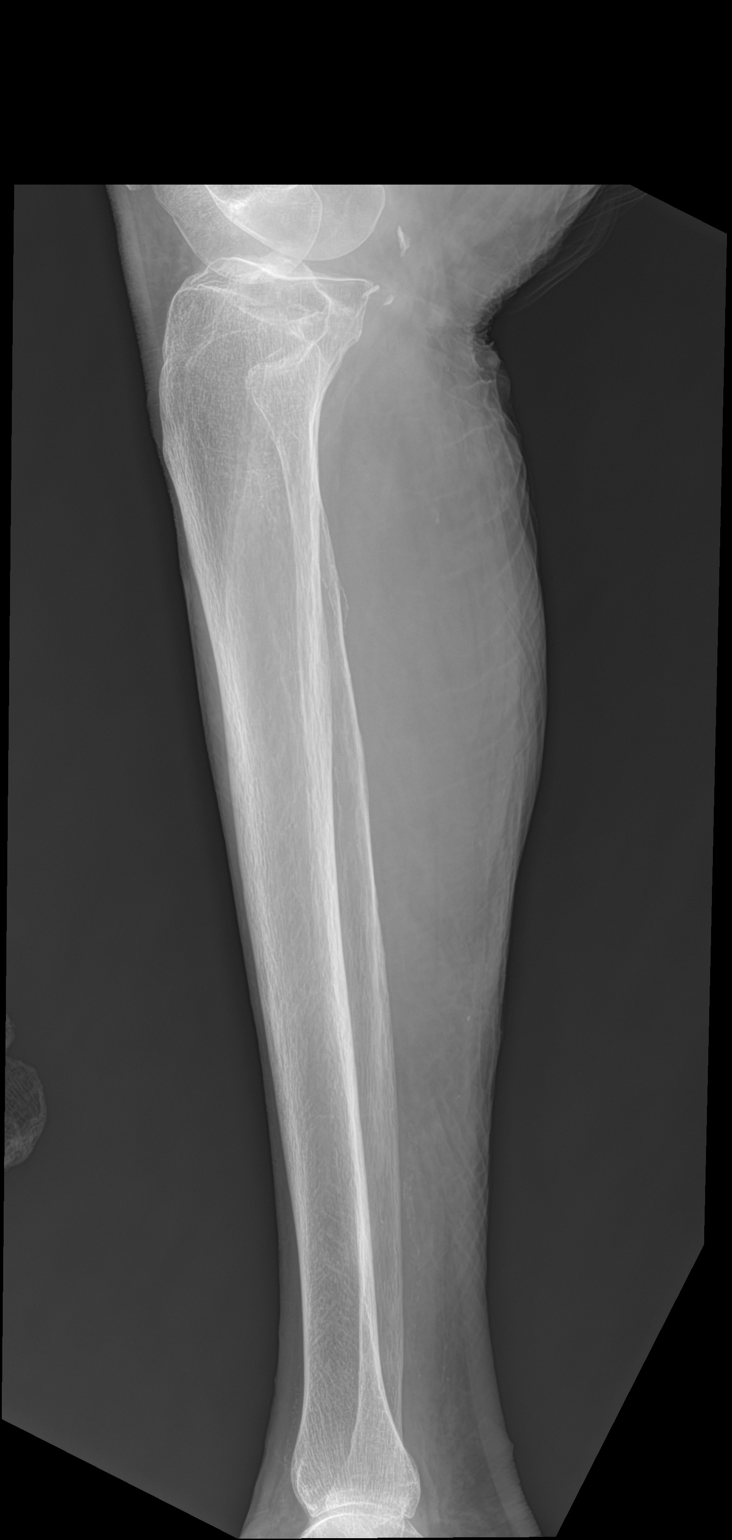

[tibia lat (2 of 2)]
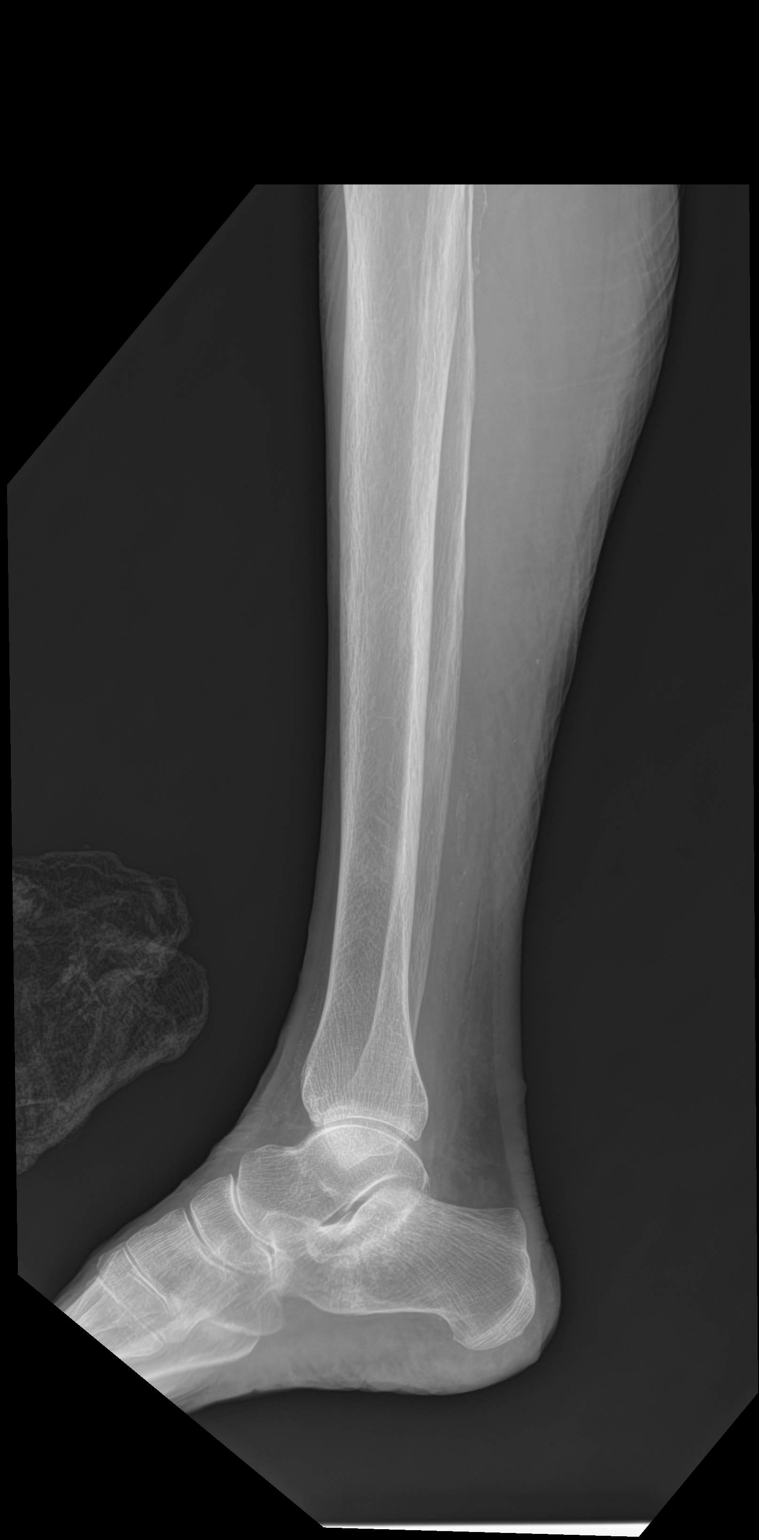

[3 of 3 positions shown; findings below may reference images not displayed]

FINDINGS: Focal skin defect along the lateral aspect of the distal right lower
leg. This corresponds to the history of laceration. No radiopaque
soft tissue foreign bodies. Bones appear intact. No evidence of
acute fracture or dislocation. No focal bone lesion or bone
destruction. Vascular calcifications.
IMPRESSION: Skin laceration to the distal right lower leg. No acute bony
abnormalities.

## 2019-07-07 DEATH — deceased
